# Patient Record
Sex: Male | Born: 2005 | Race: Asian | Hispanic: No | Marital: Single | Smoking: Never smoker
Health system: Southern US, Community
[De-identification: ages and names within clinical notes are randomized; demographics above are authoritative.]

## PROBLEM LIST (undated history)

## (undated) HISTORY — PX: FRACTURE SURGERY: SHX138

---
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
## 2023-12-17 ENCOUNTER — Ambulatory Visit (HOSPITAL_COMMUNITY)
Admission: EM | Admit: 2023-12-17 | Discharge: 2023-12-17 | Disposition: A | Discharge status: Home / Self Care | Attending: Emergency Medicine | Admitting: Emergency Medicine

## 2023-12-17 ENCOUNTER — Encounter (HOSPITAL_COMMUNITY): Payer: Self-pay

## 2023-12-17 DIAGNOSIS — S86811A Strain of other muscle(s) and tendon(s) at lower leg level, right leg, initial encounter: Secondary | ICD-10-CM

## 2023-12-17 MED ORDER — IBUPROFEN 600 MG PO TABS: 600.0000 mg | ORAL_TABLET | Freq: Four times a day (QID) | ORAL | 0 refills | Status: AC | PRN

## 2023-12-17 NOTE — Discharge Instructions (Signed)
 Your ACL, MCL and meniscus appears stable on exam.  You did have tenderness of your patellar tendon, most likely you have strained this.  Rest, ice and elevate your knee.  Take the ibuprofen  every 6 hours as needed for pain.  If your pain persists over the next week please follow-up with an orthopedic where they can perform advanced imaging such as CT and MRI.

## 2023-12-17 NOTE — ED Triage Notes (Signed)
 Pt c/o pain in R knee that began spontaneously approx 2 months ago and has been worsening the last couple days. Has not found any alleviating factors. Pain is pulling in nature. Present at rest, but worse with activity.

## 2023-12-17 NOTE — ED Provider Notes (Signed)
 [facility]    CSN: [id number] Arrival date & time: 12/17/23  1709      History   Chief Complaint Chief Complaint  Patient presents with   Knee Pain    HPI Paul Mcgee is a 18 y.o. male.   Patient presents to clinic over concern of right knee pain that has been ongoing for the past few months.  Today he stepped out of the car and kind of jarred his knee and he had pain.  Has not had any bruising, trauma, falls or swelling.  Has not tried medications or interventions.  Denies any twisting injuries.  Denies previous injury to the knee.  Ambulatory.  The history is provided by the patient and medical records.  Knee Pain   History reviewed. No pertinent past medical history.  There are no active problems to display for this patient.   Past Surgical History:  Procedure Laterality Date   FRACTURE SURGERY         Home Medications    Prior to Admission medications   Medication Sig Start Date End Date Taking? Authorizing Provider  ibuprofen  (ADVIL ) 600 MG tablet Take 1 tablet (600 mg total) by mouth every 6 (six) hours as needed. 12/17/23  Yes Aili Casillas  N, FNP  amoxicillin  (AMOXIL ) 875 MG tablet Take 1 tablet (875 mg total) by mouth 2 (two) times daily. 06/13/21   Windle Almarie ORN, PA-C    Family History No family history on file.  Social History Social History   Tobacco Use   Smoking status: Never   Smokeless tobacco: Never     Allergies   Patient has no known allergies.   Review of Systems Review of Systems  Per HPI  Physical Exam Triage Vital Signs ED Triage Vitals  Encounter Vitals Group     BP 12/17/23 1754 125/79     Girls Systolic BP Percentile --      Girls Diastolic BP Percentile --      Boys Systolic BP Percentile --      Boys Diastolic BP Percentile --      Pulse Rate 12/17/23 1754 63     Resp 12/17/23 1754 18     Temp 12/17/23 1754 98 F (36.7 C)     Temp Source 12/17/23 1754 Oral     SpO2 12/17/23 1754 98 %      Weight --      Height --      Head Circumference --      Peak Flow --      Pain Score 12/17/23 1751 5     Pain Loc --      Pain Education --      Exclude from Growth Chart --    No data found.  Updated Vital Signs BP 125/79 (BP Location: Left Arm)   Pulse 63   Temp 98 F (36.7 C) (Oral)   Resp 18   SpO2 98%   Visual Acuity Right Eye Distance:   Left Eye Distance:   Bilateral Distance:    Right Eye Near:   Left Eye Near:    Bilateral Near:     Physical Exam Vitals and nursing note reviewed.  Constitutional:      Appearance: Normal appearance.  HENT:     Head: Normocephalic and atraumatic.     Right Ear: External ear normal.     Left Ear: External ear normal.     Nose: Nose normal.     Mouth/Throat:  Mouth: Mucous membranes are moist.  Eyes:     Conjunctiva/sclera: Conjunctivae normal.  Cardiovascular:     Rate and Rhythm: Normal rate.  Pulmonary:     Effort: Pulmonary effort is normal. No respiratory distress.  Musculoskeletal:        General: Tenderness and signs of injury present. No swelling or deformity. Normal range of motion.     Right knee: Normal range of motion. Tenderness present over the patellar tendon. No MCL laxity or ACL laxity.     Instability Tests: Anterior drawer test negative. Posterior drawer test negative.       Legs:  Skin:    General: Skin is warm and dry.  Neurological:     General: No focal deficit present.     Mental Status: He is alert and oriented to person, place, and time.  Psychiatric:        Mood and Affect: Mood normal.        Behavior: Behavior normal. Behavior is cooperative.      UC Treatments / Results  Labs (all labs ordered are listed, but only abnormal results are displayed) Labs Reviewed - No data to display  EKG   Radiology No results found.  Procedures Procedures (including critical care time)  Medications Ordered in UC Medications - No data to display  Initial Impression / Assessment and  Plan / UC Course  I have reviewed the triage vital signs and the nursing notes.  Pertinent labs & imaging results that were available during my care of the patient were reviewed by me and considered in my medical decision making (see chart for details).  Vitals in triage reviewed, patient is hemodynamically stable.  Atraumatic, imaging deferred.  Does have tenderness over the patellar tendon, suspect strain and jarring injury from getting out of the car.  ACL and MCL appear intact.  Advised rest, ice and anti-inflammatories.  Orthopedic if symptoms persist for advanced imaging.  Plan of care, follow-up care return precautions given, no questions at this time.     Final Clinical Impressions(s) / UC Diagnoses   Final diagnoses:  Strain of right patellar tendon, initial encounter     Discharge Instructions      Your ACL, MCL and meniscus appears stable on exam.  You did have tenderness of your patellar tendon, most likely you have strained this.  Rest, ice and elevate your knee.  Take the ibuprofen  every 6 hours as needed for pain.  If your pain persists over the next week please follow-up with an orthopedic where they can perform advanced imaging such as CT and MRI.     ED Prescriptions     Medication Sig Dispense Auth. Provider   ibuprofen  (ADVIL ) 600 MG tablet Take 1 tablet (600 mg total) by mouth every 6 (six) hours as needed. 30 tablet Dreama, Tyria Springer  N, FNP      PDMP not reviewed this encounter.   Dreama, Garima Chronis  N, FNP 12/17/23 [phone]
